# Patient Record
Sex: Female | Born: 1970 | Hispanic: Yes | Smoking: Never smoker
Health system: Southern US, Community
[De-identification: ages and names within clinical notes are randomized; demographics above are authoritative.]

## PROBLEM LIST (undated history)

## (undated) DIAGNOSIS — R519 Headache, unspecified: Secondary | ICD-10-CM

## (undated) DIAGNOSIS — B009 Herpesviral infection, unspecified: Secondary | ICD-10-CM

## (undated) DIAGNOSIS — I10 Essential (primary) hypertension: Secondary | ICD-10-CM

## (undated) DIAGNOSIS — E785 Hyperlipidemia, unspecified: Secondary | ICD-10-CM

## (undated) DIAGNOSIS — D649 Anemia, unspecified: Secondary | ICD-10-CM

## (undated) DIAGNOSIS — A159 Respiratory tuberculosis unspecified: Secondary | ICD-10-CM

## (undated) DIAGNOSIS — R51 Headache: Secondary | ICD-10-CM

## (undated) HISTORY — PX: CHOLECYSTECTOMY: SHX55

## (undated) HISTORY — DX: Anemia, unspecified: D64.9

## (undated) HISTORY — DX: Headache: R51

## (undated) HISTORY — DX: Herpesviral infection, unspecified: B00.9

## (undated) HISTORY — DX: Essential (primary) hypertension: I10

## (undated) HISTORY — DX: Respiratory tuberculosis unspecified: A15.9

## (undated) HISTORY — DX: Hyperlipidemia, unspecified: E78.5

## (undated) HISTORY — PX: OTHER SURGICAL HISTORY: SHX169

## (undated) HISTORY — DX: Headache, unspecified: R51.9

---

## 2004-07-31 ENCOUNTER — Emergency Department: Payer: Self-pay | Admitting: General Practice

## 2007-07-05 ENCOUNTER — Ambulatory Visit: Payer: Self-pay | Admitting: Family Medicine

## 2007-08-26 ENCOUNTER — Emergency Department: Payer: Self-pay | Admitting: Emergency Medicine

## 2009-02-10 ENCOUNTER — Emergency Department: Payer: Self-pay | Admitting: Emergency Medicine

## 2013-05-10 DIAGNOSIS — N814 Uterovaginal prolapse, unspecified: Secondary | ICD-10-CM | POA: Insufficient documentation

## 2013-05-10 DIAGNOSIS — N3946 Mixed incontinence: Secondary | ICD-10-CM | POA: Insufficient documentation

## 2013-05-10 DIAGNOSIS — IMO0002 Reserved for concepts with insufficient information to code with codable children: Secondary | ICD-10-CM | POA: Insufficient documentation

## 2013-06-18 ENCOUNTER — Emergency Department: Payer: Self-pay | Admitting: Emergency Medicine

## 2013-08-02 DIAGNOSIS — K5900 Constipation, unspecified: Secondary | ICD-10-CM | POA: Insufficient documentation

## 2013-10-10 HISTORY — PX: COLONOSCOPY: SHX174

## 2014-01-08 ENCOUNTER — Ambulatory Visit: Payer: Self-pay | Admitting: Gastroenterology

## 2014-02-11 ENCOUNTER — Ambulatory Visit: Payer: Self-pay | Admitting: Gastroenterology

## 2014-02-21 LAB — PATHOLOGY REPORT

## 2014-02-24 ENCOUNTER — Ambulatory Visit: Payer: Self-pay | Admitting: Surgery

## 2014-02-25 ENCOUNTER — Ambulatory Visit: Payer: Self-pay | Admitting: Surgery

## 2014-02-26 LAB — PATHOLOGY REPORT

## 2014-03-19 ENCOUNTER — Emergency Department: Payer: Self-pay | Admitting: Emergency Medicine

## 2014-03-19 LAB — URINALYSIS, COMPLETE
BACTERIA: NONE SEEN
BILIRUBIN, UR: NEGATIVE
Blood: NEGATIVE
Glucose,UR: NEGATIVE mg/dL (ref 0–75)
KETONE: NEGATIVE
Leukocyte Esterase: NEGATIVE
Nitrite: NEGATIVE
PH: 6 (ref 4.5–8.0)
Protein: NEGATIVE
SPECIFIC GRAVITY: 1.002 (ref 1.003–1.030)
Squamous Epithelial: 1
WBC UR: NONE SEEN /HPF (ref 0–5)

## 2014-03-19 LAB — CBC WITH DIFFERENTIAL/PLATELET
BASOS ABS: 0.1 10*3/uL (ref 0.0–0.1)
Basophil %: 0.7 %
Eosinophil #: 0.2 10*3/uL (ref 0.0–0.7)
Eosinophil %: 2.7 %
HCT: 34.3 % — ABNORMAL LOW (ref 35.0–47.0)
HGB: 10.6 g/dL — ABNORMAL LOW (ref 12.0–16.0)
LYMPHS PCT: 33 %
Lymphocyte #: 2.8 10*3/uL (ref 1.0–3.6)
MCH: 23 pg — AB (ref 26.0–34.0)
MCHC: 30.9 g/dL — AB (ref 32.0–36.0)
MCV: 74 fL — AB (ref 80–100)
Monocyte #: 0.6 x10 3/mm (ref 0.2–0.9)
Monocyte %: 6.7 %
NEUTROS ABS: 4.8 10*3/uL (ref 1.4–6.5)
Neutrophil %: 56.9 %
Platelet: 337 10*3/uL (ref 150–440)
RBC: 4.61 10*6/uL (ref 3.80–5.20)
RDW: 17.1 % — AB (ref 11.5–14.5)
WBC: 8.5 10*3/uL (ref 3.6–11.0)

## 2014-03-19 LAB — LIPASE, BLOOD: LIPASE: 181 U/L (ref 73–393)

## 2014-03-19 LAB — COMPREHENSIVE METABOLIC PANEL
ALBUMIN: 3.9 g/dL (ref 3.4–5.0)
ANION GAP: 4 — AB (ref 7–16)
Alkaline Phosphatase: 71 U/L
BUN: 13 mg/dL (ref 7–18)
Bilirubin,Total: 0.6 mg/dL (ref 0.2–1.0)
CALCIUM: 9.1 mg/dL (ref 8.5–10.1)
CHLORIDE: 105 mmol/L (ref 98–107)
Co2: 28 mmol/L (ref 21–32)
Creatinine: 0.73 mg/dL (ref 0.60–1.30)
EGFR (African American): >60
EGFR (Non-African Amer.): >60
Glucose: 96 mg/dL (ref 65–99)
Osmolality: 274 (ref 275–301)
POTASSIUM: 3.8 mmol/L (ref 3.5–5.1)
SGOT(AST): 15 U/L (ref 15–37)
SGPT (ALT): 20 U/L (ref 12–78)
SODIUM: 137 mmol/L (ref 136–145)
TOTAL PROTEIN: 8 g/dL (ref 6.4–8.2)

## 2014-05-20 ENCOUNTER — Ambulatory Visit: Payer: Self-pay | Admitting: Gastroenterology

## 2014-06-03 ENCOUNTER — Ambulatory Visit: Payer: Self-pay | Admitting: Gastroenterology

## 2015-01-30 ENCOUNTER — Ambulatory Visit: Admit: 2015-01-30 | Payer: Self-pay | Admitting: Hematology and Oncology

## 2015-01-31 NOTE — Op Note (Signed)
PATIENT NAME:  Alisha Wang, Alisha Wang MR#:  [id number] DATE OF BIRTH:  1971-02-04  DATE OF PROCEDURE:  02/25/2014  PREOPERATIVE DIAGNOSIS: Chronic cholecystitis and cholelithiasis.   POSTOPERATIVE DIAGNOSIS: Chronic cholecystitis and cholelithiasis.   PROCEDURE: Laparoscopic cholecystectomy and cholangiogram.   SURGEON: Renda RollsWilton Tejon Gracie, M.D.   ANESTHESIA: General.   INDICATIONS: This 44 year old female has a history of epigastric pain and ultrasound findings of gallstones and surgery was recommended for definitive treatment.   DESCRIPTION OF PROCEDURE: The patient was placed on the operating table in the supine position under general anesthesia. The abdomen was prepared with ChloraPrep and draped in a sterile manner. The first incision was made in the inferior aspect of the umbilicus and carried down to the deep fascia, which was grasped with laryngeal hook and elevated. A Veress needle was inserted, aspirated, and irrigated with a saline solution. Next, the peritoneal cavity was inflated with carbon dioxide. The Veress needle was removed. The 10 mm cannula was inserted. The 10 mm, 0 degree laparoscope was inserted to view the peritoneal cavity. Another incision was made in the epigastrium, slightly to the right of the midline to introduce an 11 mm cannula. Two incisions were made in the lateral aspect of the right upper quadrant to introduce two 5 mm cannulas.   Next, with the patient in the reverse Trendelenburg position and turned several degrees to the left, the gallbladder was retracted towards the right shoulder. The gallbladder appeared to have a somewhat white appearance. Multiple adhesions were taken down with blunt and sharp dissection. The pouch of Jon BillingsMorrison was retracted inferiorly and laterally applying traction on the cystic duct. The porta hepatis was demonstrated. A circumferential dissection was carried out around the cystic duct and around the cystic artery. The gallbladder neck was mobilized  with incision of the visceral peritoneum. A critical view of safety was demonstrated. An Endo Clip was placed across the cystic duct adjacent to the neck of the gallbladder. An incision was made in the cystic duct to introduce a Reddick catheter. Half-strength Conray-60 dye was injected as the cholangiogram was done with fluoroscopy, viewing the biliary tree and flow of dye into the duodenum. No retained stones were seen. The Reddick catheter was removed. The cystic duct was doubly ligated with endoclips and divided. The cystic artery was controlled with endoclips and divided. The gallbladder was dissected free from the liver with hook and cautery. The site was irrigated with heparinized saline solution and aspirated. There was minimal bleeding and hemostasis was subsequently intact. The gallbladder was delivered up through the infraumbilical incision, opened and suctioned. Multiple stones were removed with the stone scoop and with the stone forceps. The gallbladder and stones were submitted in formalin for routine pathology.   The right upper quadrant was further inspected. Hemostasis was intact. The cannulas were removed. Carbon dioxide was allowed to escape from the peritoneal cavity. Skin incisions were closed with interrupted 5-0 chromic subcuticular suture, benzoin and Steri-Strips. Dressings were applied with paper tape. The patient tolerated surgery satisfactorily and was then prepared for transfer to the recovery room.   ____________________________ Alisha CommonsJ. Renda RollsWilton Kolston Lacount, Alisha Wang jws:aw D: 02/25/2014 12:02:07 ET T: 02/25/2014 12:19:20 ET JOB#: [id number]  cc: Alisha HareJ. Wilton Jahni Paul, Alisha Wang, <Dictator> Alisha Wang ELECTRONICALLY SIGNED 03/03/2014 9:11

## 2015-02-12 ENCOUNTER — Inpatient Hospital Stay: Payer: BLUE CROSS/BLUE SHIELD | Attending: Hematology and Oncology | Admitting: Hematology and Oncology

## 2015-02-12 ENCOUNTER — Ambulatory Visit: Payer: Self-pay | Admitting: Hematology and Oncology

## 2015-02-12 ENCOUNTER — Encounter: Payer: Self-pay | Admitting: Hematology and Oncology

## 2015-02-12 ENCOUNTER — Other Ambulatory Visit: Payer: BLUE CROSS/BLUE SHIELD

## 2015-02-12 VITALS — BP 133/85 | HR 85 | Temp 97.9°F | Resp 18 | Ht 64.0 in | Wt 177.5 lb

## 2015-02-12 DIAGNOSIS — Z9049 Acquired absence of other specified parts of digestive tract: Secondary | ICD-10-CM | POA: Insufficient documentation

## 2015-02-12 DIAGNOSIS — D509 Iron deficiency anemia, unspecified: Secondary | ICD-10-CM | POA: Diagnosis present

## 2015-02-12 DIAGNOSIS — Z79899 Other long term (current) drug therapy: Secondary | ICD-10-CM | POA: Diagnosis not present

## 2015-02-12 DIAGNOSIS — R42 Dizziness and giddiness: Secondary | ICD-10-CM | POA: Diagnosis not present

## 2015-02-12 DIAGNOSIS — K648 Other hemorrhoids: Secondary | ICD-10-CM | POA: Diagnosis not present

## 2015-02-12 DIAGNOSIS — K8020 Calculus of gallbladder without cholecystitis without obstruction: Secondary | ICD-10-CM

## 2015-02-12 DIAGNOSIS — E785 Hyperlipidemia, unspecified: Secondary | ICD-10-CM | POA: Insufficient documentation

## 2015-02-12 DIAGNOSIS — Z90710 Acquired absence of both cervix and uterus: Secondary | ICD-10-CM | POA: Diagnosis not present

## 2015-02-12 DIAGNOSIS — D649 Anemia, unspecified: Secondary | ICD-10-CM | POA: Diagnosis not present

## 2015-02-12 DIAGNOSIS — I10 Essential (primary) hypertension: Secondary | ICD-10-CM

## 2015-02-12 DIAGNOSIS — K219 Gastro-esophageal reflux disease without esophagitis: Secondary | ICD-10-CM | POA: Diagnosis not present

## 2015-02-12 LAB — CBC WITH DIFFERENTIAL/PLATELET
Basophils Absolute: 0.1 10*3/uL (ref 0–0.1)
Basophils Relative: 1 %
Eosinophils Absolute: 0.2 10*3/uL (ref 0–0.7)
Eosinophils Relative: 2 %
HCT: 39.6 % (ref 35.0–47.0)
Hemoglobin: 12.8 g/dL (ref 12.0–16.0)
Lymphocytes Relative: 36 %
Lymphs Abs: 2.8 10*3/uL (ref 1.0–3.6)
MCH: 25.9 pg — ABNORMAL LOW (ref 26.0–34.0)
MCHC: 32.3 g/dL (ref 32.0–36.0)
MCV: 80.2 fL (ref 80.0–100.0)
Monocytes Absolute: 0.6 10*3/uL (ref 0.2–0.9)
Monocytes Relative: 8 %
Neutro Abs: 4.2 10*3/uL (ref 1.4–6.5)
Neutrophils Relative %: 53 %
Platelets: 293 10*3/uL (ref 150–440)
RBC: 4.94 MIL/uL (ref 3.80–5.20)
RDW: 16 % — ABNORMAL HIGH (ref 11.5–14.5)
WBC: 7.8 10*3/uL (ref 3.6–11.0)

## 2015-02-12 LAB — COMPREHENSIVE METABOLIC PANEL
ALT: 22 U/L (ref 14–54)
AST: 22 U/L (ref 15–41)
Albumin: 4.3 g/dL (ref 3.5–5.0)
Alkaline Phosphatase: 73 U/L (ref 38–126)
Anion gap: 5 (ref 5–15)
BUN: 14 mg/dL (ref 6–20)
CO2: 26 mmol/L (ref 22–32)
Calcium: 9.1 mg/dL (ref 8.9–10.3)
Chloride: 103 mmol/L (ref 101–111)
Creatinine, Ser: 0.56 mg/dL (ref 0.44–1.00)
GFR calc Af Amer: >60 mL/min (ref >60)
GFR calc non Af Amer: >60 mL/min (ref >60)
Glucose, Bld: 92 mg/dL (ref 65–99)
Potassium: 4.6 mmol/L (ref 3.5–5.1)
Sodium: 134 mmol/L — ABNORMAL LOW (ref 135–145)
Total Bilirubin: 0.8 mg/dL (ref 0.3–1.2)
Total Protein: 8.1 g/dL (ref 6.5–8.1)

## 2015-02-12 LAB — RETICULOCYTES
RBC.: 4.94 MIL/uL (ref 3.80–5.20)
Retic Count, Absolute: 54.3 10*3/uL (ref 19.0–183.0)
Retic Ct Pct: 1.1 % (ref 0.4–3.1)

## 2015-02-12 NOTE — Progress Notes (Signed)
[facility] day:  02/12/2015  Chief Complaint: Hang Ammon is an 44 y.o. female with anemia who is referred in consultation by Dr. Arther Dames.  HPI:  The patient notes a longstanding history of anemia since prior to the birth of her son 80 years ago.  During her pregnancy, she took oral iron.  She has been on and off iron since that time.  She has never required a transfusion.    She developed abdominal pain last year.  During that time, she was noted to be anemic.  Upper endoscopy by Dr. Arther Dames on 02/11/2014 revealed a very small amount of hematin in the gastric fundus and body.  No source was seen.  Biopsies revealed no gastritis, intestinal metaplasia, malignancy or Helicobacter pylori.  Notes indicate a history of reflux.  She was previously on Protonix (discontued on 04/14/2014).  Imaging studies revealed cholelithiasis.  She underwent cholecystectomy on 02/15/2014.  She has had no abdominal pain since that time.  Colonoscopy on 05/20/2014 revealed only internal hemorrhoids.  Capsule endoscopy on 01/21/2015 small flecks of blood in the stomach and normal small bowel study.  It was recommended that she continue iron pills twice a day. She states that she stopped taking her iron pills a month ago.  The patient notes a good diet as she eats "some of everything".  She eats meat daily.  She denies any pica.  She denies any hematuria.  She denies any vaginal bleeding s/p hysterectomy in 06/2013.  Labs 9 months ago revealed a hematocrit of 33.2, hemoglobin 10.3, MCV 75.5, platelets 304,000, and WBC 7600.  CBC 3 months ago revealed a hematocrit of 36.0, hemoglobin 11.3, MCV 80.5, platelets 266,000, and WBC 8100.  CBC 3 weeks ago revealed a hematocrit of 39.5, hemoglobin 12.4, MCV 82.8, platelets 306,000, and WBC 7100.  Symptomatically, she feels well.  She notes intermittent dizzy spells.  She denies any fevers, sweats, or weight loss.  Past  Medical History  Diagnosis Date  . Anemia   . Hypertension   . Tuberculosis     skin test was positive in 1992, never had TB symptoms    Past Surgical History  Procedure Laterality Date  . Hysterectomy 2014    . Cholecystectomy    . C section 2011    . Right knee surgery 2000      Family history:  Mother- hypertension.  Father- prostate cancer and anemia.  Sister- anemia.  Social History:  reports that she has never smoked. She has never used smokeless tobacco. She reports that she drinks about 0.6 oz of alcohol per week. She reports that she does not use illicit drugs.  The patient is accompanied by her son, cousin, and Spanish interpreter.  Allergies: No Known Allergies  Current Outpatient Prescriptions  Medication Sig Dispense Refill  . atorvastatin (LIPITOR) 10 MG tablet Take 10 mg by mouth daily.    Marland Kitchen lisinopril (PRINIVIL,ZESTRIL) 20 MG tablet Take 20 mg by mouth daily.    . valACYclovir (VALTREX) 1000 MG tablet Take 1 g by mouth every morning.  1   No current facility-administered medications for this visit.    ROS  GENERAL:  Feels good.  Active.  No fevers, sweats or weight loss. PERFORMANCE STATUS (ECOG):  0 HEENT:  No visual changes, runny nose, sore throat, mouth sores or tenderness. Lungs: No shortness of breath or cough.  No hemoptysis. Cardiac:  No chest pain, palpitations, orthopnea, or PND. GI:  No nausea, vomiting, diarrhea, constipation, melena or hematochezia. GU:  No urgency, frequency, dysuria, or hematuria. Musculoskeletal:  No back pain.  No joint pain.  No muscle tenderness. Extremities:  No pain or swelling. Skin:  No rashes or skin changes. Neuro:  Intermittent headache (chronic).  Intermittent dizziness (no precipitating event).  No numbness or weakness, balance or coordination issues. Endocrine:  No diabetes, thyroid issues, hot flashes or night sweats. Psych:  No mood changes, depression or anxiety. Pain:  No focal pain. Review of systems:   All other systems reviewed and found to be negative.   Physical Exam  Blood pressure 133/85, pulse 85, temperature 97.9 F (36.6 C), temperature source Oral, resp. rate 18, height '5\' 4"'  (1.626 m), weight 177 lb 7.5 oz (80.5 kg), SpO2 99 %. GENERAL:  Well developed, well nourished woman, sitting comfortably in the exam room in no acute distress. MENTAL STATUS:  Alert and oriented to person, place and time. HEAD:  Short dark hair.  Normocephalic, atraumatic, face symmetric, no Cushingoid features. EYES:  Brown eyes.  Pupils equal round and reactive to light and accomodation.  No conjunctivitis or scleral icterus. ENT:  Oropharynx clear without lesion.  Tongue normal. Mucous membranes moist.  RESPIRATORY:  Clear to auscultation without rales, wheezes or rhonchi. CARDIOVASCULAR:  Regular rate and rhythm without murmur, rub or gallop. ABDOMEN:  Well healed laparoscopic incisions.  Soft, non-tender, with active bowel sounds, and no hepatosplenomegaly.  No masses. SKIN:  No rashes, ulcers or lesions. EXTREMITIES: No edema, no skin discoloration or tenderness.  No palpable cords. LYMPH NODES: No palpable cervical, supraclavicular, axillary or inguinal adenopathy  NEUROLOGICAL: Unremarkable. PSYCH:  Appropriate.  Appointment on 02/12/2015  Component Date Value Ref Range Status  . WBC 02/12/2015 7.8  3.6 - 11.0 K/uL Final  . RBC 02/12/2015 4.94  3.80 - 5.20 MIL/uL Final  . Hemoglobin 02/12/2015 12.8  12.0 - 16.0 g/dL Final  . HCT 02/12/2015 39.6  35.0 - 47.0 % Final  . MCV 02/12/2015 80.2  80.0 - 100.0 fL Final  . MCH 02/12/2015 25.9* 26.0 - 34.0 pg Final  . MCHC 02/12/2015 32.3  32.0 - 36.0 g/dL Final  . RDW 02/12/2015 16.0* 11.5 - 14.5 % Final  . Platelets 02/12/2015 293  150 - 440 K/uL Final  . Neutrophils Relative % 02/12/2015 53   Final  . Neutro Abs 02/12/2015 4.2  1.4 - 6.5 K/uL Final  . Lymphocytes Relative 02/12/2015 36   Final  . Lymphs Abs 02/12/2015 2.8  1.0 - 3.6 K/uL Final  .  Monocytes Relative 02/12/2015 8   Final  . Monocytes Absolute 02/12/2015 0.6  0.2 - 0.9 K/uL Final  . Eosinophils Relative 02/12/2015 2   Final  . Eosinophils Absolute 02/12/2015 0.2  0 - 0.7 K/uL Final  . Basophils Relative 02/12/2015 1   Final  . Basophils Absolute 02/12/2015 0.1  0 - 0.1 K/uL Final  . Sodium 02/12/2015 134* 135 - 145 mmol/L Corrected  . Potassium 02/12/2015 4.6  3.5 - 5.1 mmol/L Corrected  . Chloride 02/12/2015 103  101 - 111 mmol/L Corrected  . CO2 02/12/2015 26  22 - 32 mmol/L Corrected  . Glucose, Bld 02/12/2015 92  65 - 99 mg/dL Corrected  . BUN 02/12/2015 14  6 - 20 mg/dL Corrected  . Creatinine, Ser 02/12/2015 0.56  0.44 - 1.00 mg/dL Corrected  . Calcium 02/12/2015 9.1  8.9 - 10.3 mg/dL Corrected  . Total Protein 02/12/2015 8.1  6.5 - 8.1 g/dL  Corrected  . Albumin 02/12/2015 4.3  3.5 - 5.0 g/dL Corrected  . AST 02/12/2015 22  15 - 41 U/L Corrected  . ALT 02/12/2015 22  14 - 54 U/L Corrected  . Alkaline Phosphatase 02/12/2015 73  38 - 126 U/L Corrected  . Total Bilirubin 02/12/2015 0.8  0.3 - 1.2 mg/dL Corrected  . GFR calc non Af Amer 02/12/2015 >60  >60 mL/min Corrected  . GFR calc Af Amer 02/12/2015 >60  >60 mL/min Corrected   Comment: (NOTE) The eGFR has been calculated using the CKD EPI equation. This calculation has not been validated in all clinical situations. eGFR's persistently <90 mL/min signify possible Chronic Kidney Disease. CORRECTED ON 05/05 AT 1348: PREVIOUSLY REPORTED AS >60   . Anion gap 02/12/2015 5  5 - 15 Corrected  . Retic Ct Pct 02/12/2015 1.1  0.4 - 3.1 % Final  . RBC. 02/12/2015 4.94  3.80 - 5.20 MIL/uL Final  . Retic Count, Manual 02/12/2015 54.3  19.0 - 183.0 K/uL Final    Assessment:  The patient is a 89 year old woman with an 18 year history of anemia.  In 2015, she was diagnosed with microcytic anemia consistent with iron deficiency.  Labs at that time included a hematocrit of 33.2, hemoglobin 10.4 and MCV 75.5 (low).  Labs in  12/2014 revealed a hematocrit of 39.5, hemoglobin 12.4, and MCV 82.7 (normal).    EGD on 02/11/2014 revealed a very small amount of hematin in the gastric fundus and body.  No source was seen.  Biopsies were negative.  She has a history of reflux.  She was previously on Protonix ( discontinued on 04/14/2014).  Colonoscopy on 05/20/2014 revealed only internal hemorrhoids.  Capsule endoscopy on 01/21/2015 small flecks of blood in the stomach and normal small bowel study.   Diet is good.  She denies any hematuria, melena or hematochezia.  She is status post hysterectomy in 06/2013.  Symptomatically, she feels well.  She notes intermittent dizzy spells.  She denies any fevers, sweats, or weight loss.  Plan: 1)  Discuss history of mild iron deficiency anemia and negative GI work-up except for small amount of blood in stomach.  Last available CBC normal with resolution of microcytic RBC indices.  With patient off iron x 1 month, recheck labs and iron studies.  Diet is good.  Goal ferritin 100-400. 2)  Labs today:  CBC with diff, CMP, retic, ferritin, iron studies. 3)  RTC in 1-2 weeks to review labs and discuss direction of therapy.  Lequita Asal, MD  02/12/2015, 7:56 PM

## 2015-02-13 ENCOUNTER — Encounter: Payer: Self-pay | Admitting: Hematology and Oncology

## 2015-02-26 ENCOUNTER — Ambulatory Visit: Payer: BLUE CROSS/BLUE SHIELD | Admitting: Hematology and Oncology

## 2015-03-02 ENCOUNTER — Ambulatory Visit: Payer: BLUE CROSS/BLUE SHIELD | Admitting: Hematology and Oncology

## 2015-03-10 ENCOUNTER — Inpatient Hospital Stay (HOSPITAL_BASED_OUTPATIENT_CLINIC_OR_DEPARTMENT_OTHER): Payer: BLUE CROSS/BLUE SHIELD | Admitting: Hematology and Oncology

## 2015-03-10 VITALS — BP 114/75 | HR 80 | Temp 98.1°F | Resp 17 | Ht 62.0 in | Wt 179.0 lb

## 2015-03-10 DIAGNOSIS — D509 Iron deficiency anemia, unspecified: Secondary | ICD-10-CM

## 2015-03-10 DIAGNOSIS — E785 Hyperlipidemia, unspecified: Secondary | ICD-10-CM

## 2015-03-10 DIAGNOSIS — I10 Essential (primary) hypertension: Secondary | ICD-10-CM

## 2015-03-10 DIAGNOSIS — Z9049 Acquired absence of other specified parts of digestive tract: Secondary | ICD-10-CM

## 2015-03-10 DIAGNOSIS — K648 Other hemorrhoids: Secondary | ICD-10-CM

## 2015-03-10 DIAGNOSIS — Z90710 Acquired absence of both cervix and uterus: Secondary | ICD-10-CM

## 2015-03-10 DIAGNOSIS — Z79899 Other long term (current) drug therapy: Secondary | ICD-10-CM

## 2015-03-10 DIAGNOSIS — K219 Gastro-esophageal reflux disease without esophagitis: Secondary | ICD-10-CM

## 2015-03-10 LAB — CBC WITH DIFFERENTIAL/PLATELET
Basophils Absolute: 0 10*3/uL (ref 0–0.1)
Basophils Relative: 1 %
Eosinophils Absolute: 0.2 10*3/uL (ref 0–0.7)
Eosinophils Relative: 3 %
HCT: 37.9 % (ref 35.0–47.0)
Hemoglobin: 12 g/dL (ref 12.0–16.0)
Lymphocytes Relative: 36 %
Lymphs Abs: 2.6 10*3/uL (ref 1.0–3.6)
MCH: 25.3 pg — ABNORMAL LOW (ref 26.0–34.0)
MCHC: 31.6 g/dL — ABNORMAL LOW (ref 32.0–36.0)
MCV: 80.1 fL (ref 80.0–100.0)
Monocytes Absolute: 0.4 10*3/uL (ref 0.2–0.9)
Monocytes Relative: 6 %
Neutro Abs: 4 10*3/uL (ref 1.4–6.5)
Neutrophils Relative %: 54 %
Platelets: 259 10*3/uL (ref 150–440)
RBC: 4.73 MIL/uL (ref 3.80–5.20)
RDW: 15.7 % — ABNORMAL HIGH (ref 11.5–14.5)
WBC: 7.3 10*3/uL (ref 3.6–11.0)

## 2015-03-10 LAB — FERRITIN: Ferritin: 44 ng/mL (ref 11–307)

## 2015-08-17 ENCOUNTER — Other Ambulatory Visit: Payer: Self-pay | Admitting: Family Medicine

## 2015-08-17 DIAGNOSIS — Z0000 Encounter for general adult medical examination without abnormal findings: Secondary | ICD-10-CM

## 2015-08-27 ENCOUNTER — Ambulatory Visit
Admission: RE | Admit: 2015-08-27 | Discharge: 2015-08-27 | Disposition: A | Discharge status: Home / Self Care | Payer: 59 | Source: Ambulatory Visit | Attending: Family Medicine | Admitting: Family Medicine

## 2015-08-27 DIAGNOSIS — Z1231 Encounter for screening mammogram for malignant neoplasm of breast: Secondary | ICD-10-CM | POA: Diagnosis not present

## 2015-08-27 DIAGNOSIS — Z0000 Encounter for general adult medical examination without abnormal findings: Secondary | ICD-10-CM

## 2015-11-17 ENCOUNTER — Encounter: Payer: Self-pay | Admitting: Hematology and Oncology

## 2015-11-17 NOTE — Progress Notes (Signed)
[facility]  Clinic day:  03/10/2015  Chief Complaint: Alisha Wang is an 45 y.o. female with anemia who is seen for review of work-up and discussion regarding direction of therapy.  HPI:  The patient was last seen in the hematology clinic on 02/12/2015.  At that time she was seen for initial assessment. She described an 18 year history of anemia in 2015 she had a microcytic anemia consistent with iron deficiency. MCV was 75.5.  She had undergone EGD and colonoscopy and capsule study. There was a small amount of hematin in the gastric fundus and body but no source identified.   She has reflux. Capsule endoscopy revealed small flecks of blood in the stomach.  Diet appeared good. She denied any melena or hematochezia. She is status post hysterectomy.  Workup at last visit included a hematocrit 39.6, hemoglobin 12.8, MCV 80.2, platelets 293,000, white count 7800 with an ANC of 4200. Creatinine was 0.56. Liver function tests were normal. Reticulocyte count was 1.1%.   Ferritin and iron studies were inadvertently not done.  Symptomatically, she feels good.  She denies any symptoms. She notes weight gain. Regarding her diet, she eats rice, chicken, salad, and fruit. She has no pica.  She denies any melena or hematochezia. She has been off oral iron for a month.  Past Medical History  Diagnosis Date  . Anemia   . Hypertension   . Tuberculosis     skin test was positive in 1992, never had TB symptoms  . Herpes simplex     (recurrent)  . Hyperlipidemia   . Headache     Past Surgical History  Procedure Laterality Date  . Hysterectomy 2014    . Cholecystectomy    . C section 2011    . Right knee surgery 2000      Family history:  Mother- hypertension.  Father- prostate cancer and anemia.  Sister- anemia.  Social History:  reports that she has never smoked. She has never used smokeless tobacco. She reports that she drinks about 0.6 oz of alcohol per  week. She reports that she does not use illicit drugs.  The patient is accompanied by her 5 year old brother, and Charlesetta Garibaldi, a 80 year old, and Spanish interpreter.  Allergies: No Known Allergies  Current Outpatient Prescriptions  Medication Sig Dispense Refill  . atorvastatin (LIPITOR) 10 MG tablet Take 10 mg by mouth daily.    Marland Kitchen lisinopril (PRINIVIL,ZESTRIL) 20 MG tablet Take 20 mg by mouth daily.    . valACYclovir (VALTREX) 1000 MG tablet Take 1 g by mouth every morning.  1   No current facility-administered medications for this visit.    ROS  GENERAL:  Feels good.  Active.  No fevers, sweats.  Weight gain PERFORMANCE STATUS (ECOG):  0 HEENT:  No visual changes, runny nose, sore throat, mouth sores or tenderness. Lungs: No shortness of breath or cough.  No hemoptysis. Cardiac:  No chest pain, palpitations, orthopnea, or PND. GI:  No nausea, vomiting, diarrhea, constipation, melena or hematochezia. GU:  No urgency, frequency, dysuria, or hematuria. Musculoskeletal:  No back pain.  No joint pain.  No muscle tenderness. Extremities:  No pain or swelling. Skin:  No rashes or skin changes. Neuro:  Intermittent headache and dizziness.  No numbness or weakness, balance or coordination issues. Endocrine:  No diabetes, thyroid issues, hot flashes or night sweats. Psych:  No mood changes, depression or anxiety. Pain:  No focal pain. Review of systems:  All other systems reviewed and found to be negative.   Physical Exam  Blood pressure 114/75, pulse 80, temperature 98.1 F (36.7 C), temperature source Oral, resp. rate 17, height bI$  (1.575 m), weight 179 lb 0.2 oz (81.2 kg). GENERAL:  Well developed, well nourished woman, sitting comfortably in the exam room in no acute distress. MENTAL STATUS:  Alert and oriented to person, place and time. HEAD:  Short dark hair.  Normocephalic, atraumatic, face symmetric, no Cushingoid features. EYES:  Brown eyes.  No conjunctivitis or scleral  icterus. NEUROLOGICAL: Unremarkable. PSYCH:  Appropriate.  Office Visit on 03/10/2015  Component Date Value Ref Range Status  . WBC 03/10/2015 7.3  3.6 - 11.0 K/uL Final  . RBC 03/10/2015 4.73  3.80 - 5.20 MIL/uL Final  . Hemoglobin 03/10/2015 12.0  12.0 - 16.0 g/dL Final  . HCT 47/82/9562 37.9  35.0 - 47.0 % Final  . MCV 03/10/2015 80.1  80.0 - 100.0 fL Final  . MCH 03/10/2015 25.3* 26.0 - 34.0 pg Final  . MCHC 03/10/2015 31.6* 32.0 - 36.0 g/dL Final  . RDW 13/05/6577 15.7* 11.5 - 14.5 % Final  . Platelets 03/10/2015 259  150 - 440 K/uL Final  . Neutrophils Relative % 03/10/2015 54   Final  . Neutro Abs 03/10/2015 4.0  1.4 - 6.5 K/uL Final  . Lymphocytes Relative 03/10/2015 36   Final  . Lymphs Abs 03/10/2015 2.6  1.0 - 3.6 K/uL Final  . Monocytes Relative 03/10/2015 6   Final  . Monocytes Absolute 03/10/2015 0.4  0.2 - 0.9 K/uL Final  . Eosinophils Relative 03/10/2015 3   Final  . Eosinophils Absolute 03/10/2015 0.2  0 - 0.7 K/uL Final  . Basophils Relative 03/10/2015 1   Final  . Basophils Absolute 03/10/2015 0.0  0 - 0.1 K/uL Final  . Ferritin 03/10/2015 44  11 - 307 ng/mL Final    Assessment:  The patient is a 6 year old woman with an 18 year history of anemia.  In 2015, she was diagnosed with microcytic anemia consistent with iron deficiency.  Labs at that time included a hematocrit of 33.2, hemoglobin 10.4 and MCV 75.5 (low).  Labs in 12/2014 revealed a hematocrit of 39.5, hemoglobin 12.4, and MCV 82.7 (normal).  She is no longer anemic.  EGD on 02/11/2014 revealed a very small amount of hematin in the gastric fundus and body.  No source was seen.  Biopsies were negative.  She has a history of reflux.  She was previously on Protonix ( discontinued on 04/14/2014).  Colonoscopy on 05/20/2014 revealed only internal hemorrhoids.  Capsule endoscopy on 01/21/2015 small flecks of blood in the stomach and normal small bowel study.   Workup on 02/12/2015 included a hematocrit 39.6,  hemoglobin 12.8, MCV 80.2, platelets 293,000, white count 7800 with an ANC of 4200. Creatinine was 0.56. Liver function tests were normal. Reticulocyte count was 1.1%.   Ferritin and iron studies were inadvertently not done.  Diet is good.  She denies any hematuria, melena or hematochezia.  She is status post hysterectomy in 06/2013.  Symptomatically, she feels well.  She denies any fevers, sweats, or weight loss.  Plan: 1)  Review labs.  Hematocrit is now normal.  Suspect resolved iron deficiency anemia. 2)  Labs today:  CBC with diff, ferritin. 3)  Call patient with results (cell: [phone]). 4)  RTC prn.  Addendum:  CBC today includes a hematocrit of 37.9, hemoglobin 12.0, MCV 80.1, platelets 259,000, white count 7300 with  an ANC of 4000.  Ferritin is 44.   Rosey Bath, MD  03/10/2015

## 2016-07-05 ENCOUNTER — Emergency Department: Payer: Worker's Compensation

## 2016-07-05 ENCOUNTER — Emergency Department
Admission: EM | Admit: 2016-07-05 | Discharge: 2016-07-05 | Disposition: A | Discharge status: Home / Self Care | Payer: Worker's Compensation | Attending: Emergency Medicine | Admitting: Emergency Medicine

## 2016-07-05 DIAGNOSIS — I10 Essential (primary) hypertension: Secondary | ICD-10-CM | POA: Diagnosis not present

## 2016-07-05 DIAGNOSIS — Y9389 Activity, other specified: Secondary | ICD-10-CM | POA: Insufficient documentation

## 2016-07-05 DIAGNOSIS — Z79899 Other long term (current) drug therapy: Secondary | ICD-10-CM | POA: Insufficient documentation

## 2016-07-05 DIAGNOSIS — W228XXA Striking against or struck by other objects, initial encounter: Secondary | ICD-10-CM | POA: Diagnosis not present

## 2016-07-05 DIAGNOSIS — S8992XA Unspecified injury of left lower leg, initial encounter: Secondary | ICD-10-CM | POA: Diagnosis present

## 2016-07-05 DIAGNOSIS — Y929 Unspecified place or not applicable: Secondary | ICD-10-CM | POA: Insufficient documentation

## 2016-07-05 DIAGNOSIS — S8002XA Contusion of left knee, initial encounter: Secondary | ICD-10-CM | POA: Diagnosis not present

## 2016-07-05 DIAGNOSIS — Y990 Civilian activity done for income or pay: Secondary | ICD-10-CM | POA: Diagnosis not present

## 2016-07-05 MED ORDER — HYDROCODONE-ACETAMINOPHEN 5-325 MG PO TABS
1.0000 | ORAL_TABLET | ORAL | 0 refills | Status: DC | PRN
Start: 2016-07-05 — End: 2017-10-17

## 2016-07-05 MED ORDER — NAPROXEN 500 MG PO TABS: 500.0000 mg | ORAL_TABLET | Freq: Two times a day (BID) | ORAL | 0 refills | Status: DC

## 2016-07-05 NOTE — ED Notes (Signed)
Pt was given a copy of W/C paper work for herself and her employer. I unintentionally sent the remaining copies of the W/C documentation down to the lab with the patients specimen. For medical records I attempted to make a copy of the patients W/C form that I initially gave to the patient. Unfortunately the document was not legible once I printed, therefore there is no actual copy of the W/C paperwork at hand. The remaining copies are in the patients urine specimen bag which cannot be re-opened. RN and Tech assigned to care for pt have been informed of this

## 2016-07-05 NOTE — ED Notes (Signed)
Huntley DecSara, EDT in triage to complete workers comp profile

## 2016-07-05 NOTE — ED Provider Notes (Signed)
[facility] Emergency Department Provider Note   ____________________________________________   First MD Initiated Contact with Patient 07/05/16 2016     (approximate)  I have reviewed the triage vital signs and the nursing notes.   HISTORY  Chief Complaint Knee Injury    HPI Tanaysia Bhardwaj is a 45 y.o. female presents for evaluation of left knee pain. 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 Patient states that the pain radiates from her left knee down her leg. Denies any other complaints or injuries.   Past Medical History:  Diagnosis Date  . Anemia   . Headache   . Herpes simplex    (recurrent)  . Hyperlipidemia   . Hypertension   . Tuberculosis    skin test was positive in 1992, never had TB symptoms    Patient Active Problem List   Diagnosis Date Noted  . Iron deficiency anemia 03/10/2015    Past Surgical History:  Procedure Laterality Date  . C Section 2011    . CHOLECYSTECTOMY    . hysterectomy 2014    . right knee surgery 2000      Prior to Admission medications   Medication Sig Start Date End Date Taking? Authorizing Provider  atorvastatin (LIPITOR) 10 MG tablet Take 10 mg by mouth daily.    Historical Provider, MD  HYDROcodone-acetaminophen (NORCO) 5-325 MG tablet Take 1-2 tablets by mouth every 4 (four) hours as needed for moderate pain. 07/05/16   Charmayne Sheer Sarha Bartelt, PA-C  lisinopril (PRINIVIL,ZESTRIL) 20 MG tablet Take 20 mg by mouth daily.    Historical Provider, MD  naproxen (NAPROSYN) 500 MG tablet Take 1 tablet (500 mg total) by mouth 2 (two) times daily with a meal. 07/05/16   Evangeline Dakin, PA-C  valACYclovir (VALTREX) 1000 MG tablet Take 1 g by mouth every morning. 01/17/15   Historical Provider, MD    Allergies Review of patient's allergies indicates no known allergies.  Family History  Problem Relation Age of Onset  . Hypertension Mother   . Anemia Father   . Prostate cancer Father   .  Anemia Sister     Social History Social History  Substance Use Topics  . Smoking status: Never Smoker  . Smokeless tobacco: Never Used  . Alcohol use 0.6 oz/week    1 Cans of beer per week     Comment: 1-2 /month    Review of Systems Constitutional: No fever/chills Eyes: No visual changes. ENT: No sore throat. Cardiovascular: Denies chest pain. Respiratory: Denies shortness of breath. Gastrointestinal: No abdominal pain.  No nausea, no vomiting.  No diarrhea.  No constipation. Genitourinary: Negative for dysuria. Musculoskeletal: Positive for left knee pain. Skin: Negative for rash. Neurological: Negative for headaches, focal weakness or numbness.  10-point ROS otherwise negative.  ____________________________________________   PHYSICAL EXAM:  VITAL SIGNS: ED Triage Vitals  Enc Vitals Group     BP 07/05/16 1946 (!) 150/83     Pulse Rate 07/05/16 1946 92     Resp 07/05/16 1946 18     Temp 07/05/16 1946 97.6 F (36.4 C)     Temp Source 07/05/16 1946 Oral     SpO2 07/05/16 1946 99 %     Weight 07/05/16 1946 170 lb (77.1 kg)     Height 07/05/16 1946 5\' 4"  (1.626 m)     Head Circumference --      Peak Flow --      Pain Score 07/05/16 1947 8  Pain Loc --      Pain Edu? --      Excl. in GC? --     Constitutional: Alert and oriented. Well appearing and in no acute distress. Musculoskeletal: No lower extremity tenderness nor edema.  No joint effusions. Neurologic:  Normal speech and language. No gross focal neurologic deficits are appreciated. Gait not tested secondary to pain. No patellar tenderness point tenderness noted to the lateral aspect of the knees. Skin:  Skin is warm, dry and intact. No rash noted. Psychiatric: Mood and affect are normal. Speech and behavior are normal.  ____________________________________________   LABS (all labs ordered are listed, but only abnormal results are displayed)  Labs Reviewed - No data to  display ____________________________________________  EKG   ____________________________________________  RADIOLOGY  No acute osseous findings. ____________________________________________   PROCEDURES  Procedure(s) performed: None  Procedures  Critical Care performed: No  ____________________________________________   INITIAL IMPRESSION / ASSESSMENT AND PLAN / ED COURSE  Pertinent labs & imaging results that were available during my care of the patient were reviewed by me and considered in my medical decision making (see chart for details).  Status post fall acute left knee contusion. Rx given for Naprosyn 500 mg twice a day. Allergies of ice and elevation. Work excuse 24 hours. Use crutches as needed for ambulation. Follow-up with orthopedics if not feeling better in the next 3-4 days.  Clinical Course     ____________________________________________   FINAL CLINICAL IMPRESSION(S) / ED DIAGNOSES  Final diagnoses:  Knee contusion, left, initial encounter      NEW MEDICATIONS STARTED DURING THIS VISIT:  New Prescriptions   HYDROCODONE-ACETAMINOPHEN (NORCO) 5-325 MG TABLET    Take 1-2 tablets by mouth every 4 (four) hours as needed for moderate pain.   NAPROXEN (NAPROSYN) 500 MG TABLET    Take 1 tablet (500 mg total) by mouth 2 (two) times daily with a meal.     Note:  This document was prepared using Dragon voice recognition software and may include unintentional dictation errors.   Evangeline Dakinharles M Tony Friscia, PA-C 07/05/16 2135    Rockne MenghiniAnne-Caroline Norman, MD 07/05/16 2217

## 2016-07-05 NOTE — ED Notes (Signed)
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 Registration notified.

## 2016-07-05 NOTE — ED Notes (Signed)
I completed pt worker comp. Everything was within normal limits and time frame. Pt compliant with total procedure. Urine was read within time limits and was accurate temperature. Pt verbalized understanding of the W/C process and refused a translator stating "im fine I understand what your saying, its ok". Pt signed neccessary documents. Urine was taken to the lab within proper time frame

## 2016-07-05 NOTE — ED Triage Notes (Signed)
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

## 2016-07-25 ENCOUNTER — Other Ambulatory Visit: Payer: Self-pay | Admitting: Family Medicine

## 2016-07-25 DIAGNOSIS — Z1231 Encounter for screening mammogram for malignant neoplasm of breast: Secondary | ICD-10-CM

## 2016-08-29 ENCOUNTER — Ambulatory Visit
Admission: RE | Admit: 2016-08-29 | Discharge: 2016-08-29 | Disposition: A | Discharge status: Home / Self Care | Payer: BLUE CROSS/BLUE SHIELD | Source: Ambulatory Visit | Attending: Family Medicine | Admitting: Family Medicine

## 2016-08-29 DIAGNOSIS — Z1231 Encounter for screening mammogram for malignant neoplasm of breast: Secondary | ICD-10-CM | POA: Diagnosis not present

## 2016-08-31 ENCOUNTER — Other Ambulatory Visit: Payer: Self-pay | Admitting: Family Medicine

## 2016-08-31 DIAGNOSIS — R928 Other abnormal and inconclusive findings on diagnostic imaging of breast: Secondary | ICD-10-CM

## 2016-09-20 ENCOUNTER — Ambulatory Visit
Admission: RE | Admit: 2016-09-20 | Discharge: 2016-09-20 | Disposition: A | Discharge status: Home / Self Care | Payer: BLUE CROSS/BLUE SHIELD | Source: Ambulatory Visit | Attending: Family Medicine | Admitting: Family Medicine

## 2016-09-20 DIAGNOSIS — R928 Other abnormal and inconclusive findings on diagnostic imaging of breast: Secondary | ICD-10-CM | POA: Diagnosis present

## 2017-08-28 ENCOUNTER — Ambulatory Visit: Payer: Self-pay | Attending: Oncology | Admitting: *Deleted

## 2017-08-28 ENCOUNTER — Encounter: Payer: Self-pay | Admitting: *Deleted

## 2017-08-28 ENCOUNTER — Encounter (INDEPENDENT_AMBULATORY_CARE_PROVIDER_SITE_OTHER): Payer: Self-pay

## 2017-08-28 VITALS — BP 121/80 | HR 87 | Temp 98.9°F | Ht 65.0 in | Wt 175.0 lb

## 2017-08-28 DIAGNOSIS — N644 Mastodynia: Secondary | ICD-10-CM

## 2017-08-28 DIAGNOSIS — N63 Unspecified lump in unspecified breast: Secondary | ICD-10-CM

## 2017-08-28 NOTE — Progress Notes (Signed)
Subjective:     Patient ID: Alisha Wang, female   DOB: 1971/03/30, 46 y.o.   MRN: [id number]  HPI   Review of Systems     Objective:   Physical Exam  Pulmonary/Chest: Right breast exhibits tenderness. Right breast exhibits no inverted nipple, no mass, no nipple discharge and no skin change. Left breast exhibits no inverted nipple, no mass, no nipple discharge, no skin change and no tenderness. Breasts are symmetrical.         Assessment:     46 [facility] old Hispanic [facility]  Lloyda, the interpreter present during the interview and exam.  Patient complains of right breast tenderness for about 4 months.  States it's at the lateral breast, is intermittent, lasting 1-2 minutes at a time.  States it feels like it is "pulling and throbbing".  It's more painful with palpated.  No alleviating factors.  States she does try to massage the area.  Rates the pain a 7 on a 0/10 scale.  On clinical breast exam there is an asymmetrical tender thickening at 10:30-11:00 right breast 2 CFN.  Taught self breast awareness.  Patient with a history of hysterectomy in 2008.  No pap today per protocol.  Patient has been screened for eligibility.  She does not have any insurance, Medicare or Medicaid.  She also meets financial eligibility.  Hand-out given on the Affordable Care Act.    Plan:     Will get bilateral diagnostic mammogram and ultrasound of the right breast.  Will follow-up per [facility] protocol.  Joellyn Quailshristy Burton to schedule patient's mammogram appointment.

## 2017-08-28 NOTE — Addendum Note (Signed)
Addended by: Jim LikeLAMBERT, SHEENA M on: 08/28/2017 01:38 PM   Modules accepted: Orders

## 2017-08-28 NOTE — Patient Instructions (Signed)
Gave patient hand-out, Women Staying Healthy, Active and Well from [facility], with education on breast health, pap smears, heart and colon health. 

## 2017-09-07 ENCOUNTER — Ambulatory Visit
Admission: RE | Admit: 2017-09-07 | Discharge: 2017-09-07 | Disposition: A | Discharge status: Home / Self Care | Payer: Self-pay | Source: Ambulatory Visit | Attending: Oncology | Admitting: Oncology

## 2017-09-07 DIAGNOSIS — N63 Unspecified lump in unspecified breast: Secondary | ICD-10-CM

## 2017-09-14 ENCOUNTER — Telehealth: Payer: Self-pay | Admitting: *Deleted

## 2017-09-14 NOTE — Telephone Encounter (Signed)
Left patient a message via Lloyda, the interpreter to call me back.  I would like to offer surgical consultation for further evaluation of the right breast thickening.

## 2017-09-19 ENCOUNTER — Telehealth: Payer: Self-pay | Admitting: *Deleted

## 2017-09-19 NOTE — Telephone Encounter (Signed)
Patient returned my call this morning.  She states her breast pain is gone.  Discussed possiblity of a surgical consult for the right breast thickening.  She is agreeable.  Appointment scheduled to see Dr. Lemar LivingsByrnett on 10/17/17 @ 1:15.  She is to be there at 12:45, take a photo ID and all her meds.  Will follow-up per [facility] protocol.

## 2017-10-16 ENCOUNTER — Ambulatory Visit: Payer: Self-pay

## 2017-10-17 ENCOUNTER — Ambulatory Visit (INDEPENDENT_AMBULATORY_CARE_PROVIDER_SITE_OTHER): Payer: Self-pay | Admitting: General Surgery

## 2017-10-17 ENCOUNTER — Encounter: Payer: Self-pay | Admitting: General Surgery

## 2017-10-17 VITALS — BP 124/72 | HR 70 | Resp 12 | Ht 64.0 in | Wt 179.0 lb

## 2017-10-17 DIAGNOSIS — I358 Other nonrheumatic aortic valve disorders: Secondary | ICD-10-CM

## 2017-10-17 DIAGNOSIS — N644 Mastodynia: Secondary | ICD-10-CM

## 2017-10-17 NOTE — Progress Notes (Signed)
Patient ID: Alisha Wang, female   DOB: [birth year], 47 y.o.   MRN: [id number]  Chief Complaint  Patient presents with  . Other    HPI Alisha Wang is a 47 y.o. female who presents for a breast evaluation. The most recent mammogram was done on 09/07/2017 and right breast ultrasound. She states she has been having pain in her right breast for about two months now. It was sharpe quick stubbing pains but much better now.  Patient doesn't  perform regular self breast checks and gets regular mammograms done.  The patient denies any nipple drainage with manipulation.  Mother of 4, 23, 52, 85 and 7.  Native of [place].  Interpreter, Beecher Mcardle is present at visit.   HPI  Past Medical History:  Diagnosis Date  . Anemia   . Headache   . Herpes simplex    (recurrent)  . Hyperlipidemia   . Hypertension   . Tuberculosis    skin test was positive in 1992, never had TB symptoms    Past Surgical History:  Procedure Laterality Date  . C Section 2011    . CHOLECYSTECTOMY    . COLONOSCOPY  2015  . hysterectomy 2014    . right knee surgery 2000      Family History  Problem Relation Age of Onset  . Hypertension Mother   . Anemia Father   . Prostate cancer Father   . Anemia Sister     Social History Social History   Tobacco Use  . Smoking status: Never Smoker  . Smokeless tobacco: Never Used  Substance Use Topics  . Alcohol use: Yes    Alcohol/week: 0.6 oz    Types: 1 Cans of beer per week    Comment: 1-2 /month  . Drug use: No    No Known Allergies  Current Outpatient Medications  Medication Sig Dispense Refill  . atorvastatin (LIPITOR) 10 MG tablet Take 10 mg by mouth daily.    Marland Kitchen lisinopril (PRINIVIL,ZESTRIL) 20 MG tablet Take 20 mg by mouth daily.    . valACYclovir (VALTREX) 1000 MG tablet Take 1 g by mouth every morning.  1   No current facility-administered medications for this visit.     Review of Systems Review of Systems  Constitutional:  Negative.   Respiratory: Negative.  Negative for shortness of breath.   Cardiovascular: Negative.  Negative for chest pain and leg swelling.    Blood pressure 124/72, pulse 70, resp. rate 12, height 5\' 4"  (1.626 m), weight 179 lb (81.2 kg).  Physical Exam Physical Exam  Constitutional: She is oriented to person, place, and time. She appears well-developed and well-nourished.  Eyes: Conjunctivae are normal. No scleral icterus.  Neck: Neck supple.  Cardiovascular: Normal rate and regular rhythm.  Murmur heard.  Systolic murmur is present with a grade of 2/6. Pulses:      Carotid pulses are 2+ on the right side, and 2+ on the left side. No transmitted murmur.  Pulmonary/Chest: Effort normal and breath sounds normal. Right breast exhibits no inverted nipple, no mass, no nipple discharge, no skin change and no tenderness. Left breast exhibits no inverted nipple, no mass, no nipple discharge, no skin change and no tenderness.    Lymphadenopathy:    She has no cervical adenopathy.    She has no axillary adenopathy.  Neurological: She is alert and oriented to person, place, and time.  Skin: Skin is warm and dry.    Data Reviewed September 07, 2017 diagnostic mammograms in  prior years exam was reviewed.  Focal thickening in the right upper outer quadrant noted during the November/December 2017 exam.  Cleared with focal spot compression views. Current study: BI-RADS-1. The patient reports she was told that she had "dense breast".  The majority of the breast is fatty replaced with the exception of the upper outer quadrant on the right side which is modestly dense at best.  Assessment    Benign breast exam.  Asymptomatic aortic outflow tract murmur.    Plan    No indication of active breast pathology at this time.    Patient to return as needed. The patient is aware to call back for any questions or concerns.   HPI, Physical Exam, Assessment and Plan have been scribed under the  direction and in the presence of Donnalee CurryJeffrey Byrnett, MD.  Ples SpecterJessica Qualls, CMA  I have completed the exam and reviewed the above documentation for accuracy and completeness.  I agree with the above.  Museum/gallery conservator[employer]agon [employer] has been used and any errors in dictation or transcription are unintentional.  Donnalee CurryJeffrey Byrnett, M.D., F.A.C.S.  Earline MayotteByrnett, Jeffrey W 10/17/2017, 1:34 PM

## 2017-10-17 NOTE — Patient Instructions (Signed)
Patient to return as needed. The patient is aware to call back for any questions or concerns. 

## 2017-11-08 ENCOUNTER — Encounter: Payer: Self-pay | Admitting: *Deleted

## 2017-11-08 NOTE — Progress Notes (Signed)
Patient with benign findings.  She is to follow-up in one year with annual screening.  HSIS to Allendalehristy.

## 2018-10-24 ENCOUNTER — Encounter (INDEPENDENT_AMBULATORY_CARE_PROVIDER_SITE_OTHER): Payer: Self-pay

## 2018-10-24 ENCOUNTER — Ambulatory Visit: Payer: Self-pay | Attending: Oncology | Admitting: *Deleted

## 2018-10-24 ENCOUNTER — Encounter: Payer: Self-pay | Admitting: *Deleted

## 2018-10-24 ENCOUNTER — Ambulatory Visit
Admission: RE | Admit: 2018-10-24 | Discharge: 2018-10-24 | Disposition: A | Discharge status: Home / Self Care | Payer: Self-pay | Source: Ambulatory Visit | Attending: Oncology | Admitting: Oncology

## 2018-10-24 ENCOUNTER — Ambulatory Visit: Payer: Self-pay

## 2018-10-24 VITALS — BP 131/83 | HR 83 | Temp 98.4°F | Ht 65.0 in | Wt 191.3 lb

## 2018-10-24 DIAGNOSIS — Z0000 Encounter for general adult medical examination without abnormal findings: Secondary | ICD-10-CM

## 2018-10-24 NOTE — Progress Notes (Signed)
  Subjective:     Patient ID: Gabryela Traister, female   DOB: December 30, 1970, 48 y.o.   MRN: [id number]  HPI   Review of Systems     Objective:   Physical Exam Chest:     Breasts:        Right: No swelling, bleeding, inverted nipple, mass, nipple discharge, skin change or tenderness.        Left: No swelling, bleeding, inverted nipple, mass, nipple discharge, skin change or tenderness.        Assessment:     48 year old English speaking Hispanic female returns to [facility] for annual screening.  Clinical breast exam unremarkable. Taught self breast awareness.  Patient has been screened for eligibility.  She does not have any insurance, Medicare or Medicaid.  She also meets financial eligibility.  Hand-out given on the Affordable Care Act.  Risk Assessment    Risk Scores      10/24/2018   Last edited by: Alta Corning, CMA   5-year risk: 0.5 %   Lifetime risk: 6 %            Plan:     Screening mammogram ordered.  Will follow-up per [facility] protocol.

## 2018-10-24 NOTE — Patient Instructions (Signed)
Gave patient hand-out, Women Staying Healthy, Active and Well from [facility], with education on breast health, pap smears, heart and colon health. 

## 2018-10-29 ENCOUNTER — Encounter: Payer: Self-pay | Admitting: *Deleted

## 2018-10-29 NOTE — Progress Notes (Signed)
Letter mailed from the [facility] to inform patient of her normal mammogram results.  Patient is to follow-up with annual screening in one year.  HSIS to Christy. 

## 2019-11-08 ENCOUNTER — Telehealth: Payer: Self-pay

## 2019-11-11 NOTE — Progress Notes (Signed)
Patient pre-screened for [facility] eligibility due to COVID 19 precautions. Two patient identifiers used for verification that I was speaking to correct patient.  Patient to Present directly to [facility] 11/11/19 for [facility] screening mammogram.

## 2019-11-12 ENCOUNTER — Encounter: Payer: Self-pay | Admitting: *Deleted

## 2019-11-12 ENCOUNTER — Ambulatory Visit: Payer: Self-pay | Attending: Oncology | Admitting: *Deleted

## 2019-11-12 ENCOUNTER — Ambulatory Visit
Admission: RE | Admit: 2019-11-12 | Discharge: 2019-11-12 | Disposition: A | Discharge status: Home / Self Care | Payer: Self-pay | Source: Ambulatory Visit | Attending: Oncology | Admitting: Oncology

## 2019-11-12 DIAGNOSIS — Z0000 Encounter for general adult medical examination without abnormal findings: Secondary | ICD-10-CM | POA: Insufficient documentation

## 2019-11-12 NOTE — Progress Notes (Signed)
  Subjective:     Patient ID: Alisha Wang, female   DOB: [birth year], 49 y.o.   MRN: [id number]  HPI   Review of Systems     Objective:   Physical Exam     Assessment:     Due to Covid 19 pandemic a televist was used to enroll patient into our [facility] program and obtain her health history.  Patient denies any breast problems.  Patient has a history of hysterectomy for fibroids.  Pap omitted per protocol.  Patient has been screened for eligibility.  She does not have any insurance, Medicare or Medicaid.  She also meets financial eligibility.  See Dayna Barker breast cancer risk assessment below. Risk Assessment    Risk Scores      11/12/2019 10/24/2018   Last edited by: Scarlett Presto, RN [place], Freada Bergeron, CMA   5-year risk: 0.6 % 0.5 %   Lifetime risk: 5.9 % 6 %            Plan:     Screening mammogram ordered.  Will follow up per [facility] protocol.

## 2019-11-13 ENCOUNTER — Encounter: Payer: Self-pay | Admitting: *Deleted

## 2019-11-13 NOTE — Progress Notes (Signed)
Letter mailed from the [facility] to inform patient of her normal mammogram results.  Patient is to follow-up with annual screening in one year. 

## 2020-11-11 ENCOUNTER — Other Ambulatory Visit: Payer: Self-pay

## 2020-11-11 ENCOUNTER — Encounter: Payer: Self-pay | Admitting: *Deleted

## 2020-11-11 ENCOUNTER — Ambulatory Visit: Payer: Self-pay | Attending: Oncology | Admitting: *Deleted

## 2020-11-11 ENCOUNTER — Ambulatory Visit
Admission: RE | Admit: 2020-11-11 | Discharge: 2020-11-11 | Disposition: A | Discharge status: Home / Self Care | Payer: Self-pay | Source: Ambulatory Visit | Attending: Oncology | Admitting: Oncology

## 2020-11-11 DIAGNOSIS — Z0000 Encounter for general adult medical examination without abnormal findings: Secondary | ICD-10-CM

## 2020-11-11 NOTE — Progress Notes (Signed)
Patient pre-screened for [facility] eligibility due to COVID 19 precautions. Two patient identifiers used for verification that I was speaking to correct patient.  Patient to Present directly to [facility] today for [facility] screening mammogram.  °

## 2020-11-12 ENCOUNTER — Encounter: Payer: Self-pay | Admitting: *Deleted

## 2020-11-12 NOTE — Progress Notes (Signed)
Letter mailed from the [facility] to inform patient of her normal mammogram results.  Patient is to follow-up with annual screening in one year. 

## 2021-10-27 ENCOUNTER — Other Ambulatory Visit: Payer: Self-pay

## 2021-10-27 DIAGNOSIS — Z1231 Encounter for screening mammogram for malignant neoplasm of breast: Secondary | ICD-10-CM

## 2021-11-17 ENCOUNTER — Ambulatory Visit: Payer: Self-pay | Attending: Oncology

## 2022-01-25 ENCOUNTER — Ambulatory Visit: Payer: Self-pay

## 2022-03-02 ENCOUNTER — Other Ambulatory Visit: Payer: Self-pay

## 2022-03-02 DIAGNOSIS — Z1211 Encounter for screening for malignant neoplasm of colon: Secondary | ICD-10-CM

## 2022-03-09 ENCOUNTER — Ambulatory Visit
Admission: RE | Admit: 2022-03-09 | Discharge: 2022-03-09 | Disposition: A | Discharge status: Home / Self Care | Payer: Self-pay | Source: Ambulatory Visit | Attending: Obstetrics and Gynecology | Admitting: Obstetrics and Gynecology

## 2022-03-09 ENCOUNTER — Ambulatory Visit: Payer: Self-pay | Attending: Hematology and Oncology | Admitting: *Deleted

## 2022-03-09 VITALS — BP 143/98 | HR 75 | Resp 18 | Wt 192.0 lb

## 2022-03-09 DIAGNOSIS — Z1231 Encounter for screening mammogram for malignant neoplasm of breast: Secondary | ICD-10-CM | POA: Insufficient documentation

## 2022-03-09 DIAGNOSIS — Z1239 Encounter for other screening for malignant neoplasm of breast: Secondary | ICD-10-CM

## 2022-03-09 NOTE — Progress Notes (Signed)
[name] is a 51 y.o. female who presents to [facility] clinic today with no complaints.    Pap Smear: Pap smear not completed today. Last Pap smear was 12/28/2009 at [facility] clinic and was normal. Per patient has no history of an abnormal Pap smear. Patient has a history of a hysterectomy 06/13/2013 due to uterovaginal prolapse and urge incontinence. Patient doesn't need any further Pap smears due to her history of a hysterectomy for benign reasons per [facility] and ASCCP guidelines. Last Pap smear result is available in Epic.   Physical exam: Breasts Breasts symmetrical. No skin abnormalities bilateral breasts. No nipple retraction bilateral breasts. No nipple discharge bilateral breasts. No lymphadenopathy. No lumps palpated bilateral breasts. No complaints of pain or tenderness on exam.     MS DIGITAL SCREENING TOMO BILATERAL  Result Date: 11/11/2020 CLINICAL DATA:  Screening. EXAM: DIGITAL SCREENING BILATERAL MAMMOGRAM WITH TOMO AND CAD COMPARISON:  Previous exam(s). ACR Breast Density Category b: There are scattered areas of fibroglandular density. FINDINGS: There are no findings suspicious for malignancy. The images were evaluated with computer-aided detection. IMPRESSION: No mammographic evidence of malignancy. A result letter of this screening mammogram will be mailed directly to the patient. RECOMMENDATION: Screening mammogram in one year. (Code:SM-B-01Y) BI-RADS CATEGORY  1: Negative. Electronically Signed   By: Beckie Salts M.D.   On: 11/11/2020 14:23   MS DIGITAL SCREENING TOMO BILATERAL  Result Date: 11/12/2019 CLINICAL DATA:  Screening. EXAM: DIGITAL SCREENING BILATERAL MAMMOGRAM WITH TOMO AND CAD COMPARISON:  Previous exam(s). ACR Breast Density Category b: There are scattered areas of fibroglandular density. FINDINGS: There are no findings suspicious for malignancy. Images were processed with CAD. IMPRESSION: No mammographic evidence of malignancy. A result letter of this screening  mammogram will be mailed directly to the patient. RECOMMENDATION: Screening mammogram in one year. (Code:SM-B-01Y) BI-RADS CATEGORY  1: Negative. Electronically Signed   By: Baird Lyons M.D.   On: 11/12/2019 15:38   MS DIGITAL SCREENING TOMO BILATERAL  Result Date: 10/24/2018 CLINICAL DATA:  Screening. EXAM: DIGITAL SCREENING BILATERAL MAMMOGRAM WITH TOMO AND CAD COMPARISON:  Previous exam(s). ACR Breast Density Category b: There are scattered areas of fibroglandular density. FINDINGS: There are no findings suspicious for malignancy. Images were processed with CAD. IMPRESSION: No mammographic evidence of malignancy. A result letter of this screening mammogram will be mailed directly to the patient. RECOMMENDATION: Screening mammogram in one year. (Code:SM-B-01Y) BI-RADS CATEGORY  1: Negative. Electronically Signed   By: Sande Brothers M.D.   On: 10/24/2018 10:54   MS DIGITAL DIAG TOMO BILAT  Result Date: 09/07/2017 CLINICAL DATA:  Focal pain right breast EXAM: 2D DIGITAL DIAGNOSTIC BILATERAL MAMMOGRAM WITH CAD AND ADJUNCT TOMO ULTRASOUND RIGHT BREAST COMPARISON:  Previous exam(s). ACR Breast Density Category b: There are scattered areas of fibroglandular density. FINDINGS: CC and MLO views of bilateral breasts and spot tangential view of right breast are submitted. No suspicious abnormalities identified bilaterally. Mammographic images were processed with CAD. Targeted ultrasound is performed, showing no focal abnormal discrete cystic or solid lesion in focal areas of pain from right breast 8 o'clock through 10 o'clock near lateral chest wall. IMPRESSION: Negative. RECOMMENDATION: Routine screening mammogram in 1 year. I have discussed the findings and recommendations with the patient. Results were also provided in writing at the conclusion of the visit. If applicable, a reminder letter will be sent to the patient regarding the next appointment. BI-RADS CATEGORY  1: Negative. Electronically Signed   By:  Gabriel Carina.D.  On: 09/07/2017 10:02     Pelvic/Bimanual Pap is not indicated today per [facility] guidelines.   Smoking History: Patient has never smoked.   Patient Navigation: Patient education provided. Access to services provided for patient through [facility] program.   Colorectal Cancer Screening: Per patient has never had colonoscopy completed. Patient completed a FIT Test 10/29/2021 that was negative. No complaints today.    Breast and Cervical Cancer Risk Assessment: Patient does not have family history of breast cancer, known genetic mutations, or radiation treatment to the chest before age 59. Patient does not have history of cervical dysplasia, immunocompromised, or DES exposure in-utero.  Risk Assessment     Risk Scores       03/09/2022 11/11/2020   Last edited by: Lesle Chris, RN Scarlett Presto, RN   5-year risk: 0.6 % 0.6 %   Lifetime risk: 5.2 % 5.8 %            A: [facility] exam without pap smear No complaints.  P: Referred patient to the [facility] for a screening mammogram. Appointment scheduled Wednesday, Mar 09, 2022 at 1240.  Priscille Heidelberg, RN 03/09/2022 12:34 PM

## 2022-03-09 NOTE — Patient Instructions (Signed)
Explained breast self awareness with Alisha Wang. Patient did not need a Pap smear today due to patient has a history of a hysterectomy for benign reasons. Let patient know that she doesn't need any further Pap smears due to her history of a hysterectomy for benign reasons. Referred patient to the [facility] for a screening mammogram. Appointment scheduled Wednesday, Mar 09, 2022 at 1240. Patient aware of appointment and will be there. Let patient know Delford Field will follow up with her within the next couple weeks with results of mammogram by letter or phone. Alisha Wang verbalized understanding.  Orien Mayhall, Kathaleen Maser, RN 12:34 PM

## 2022-06-27 IMAGING — MG MM DIGITAL SCREENING BILAT W/ TOMO AND CAD
6 of 10 series · 6 of 30 positions shown · non-contrast
Comparison: Previous exam(s).

CLINICAL DATA: Screening.

EXAM:
DIGITAL SCREENING BILATERAL MAMMOGRAM WITH TOMOSYNTHESIS AND CAD
TECHNIQUE: Bilateral screening digital craniocaudal and mediolateral oblique
mammograms were obtained. Bilateral screening digital breast
tomosynthesis was performed. The images were evaluated with
computer-aided detection.

[R MLO synth-2D (1 of 2)]
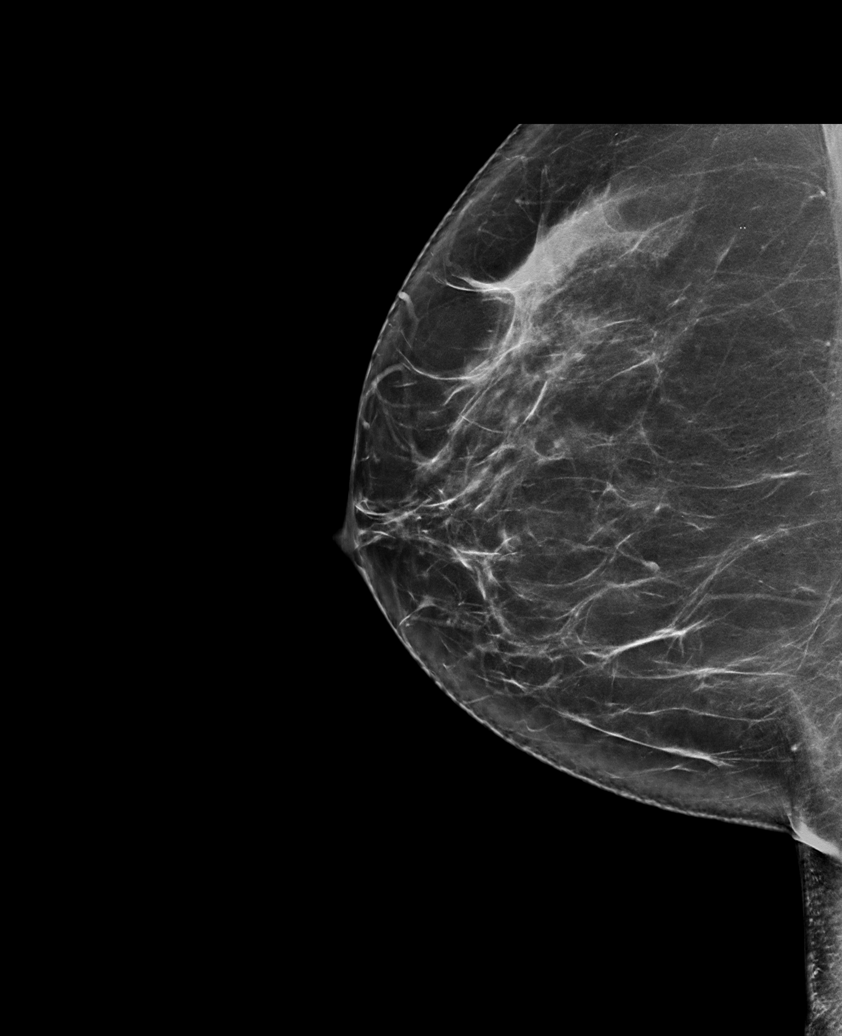

[R CC synth-2D]
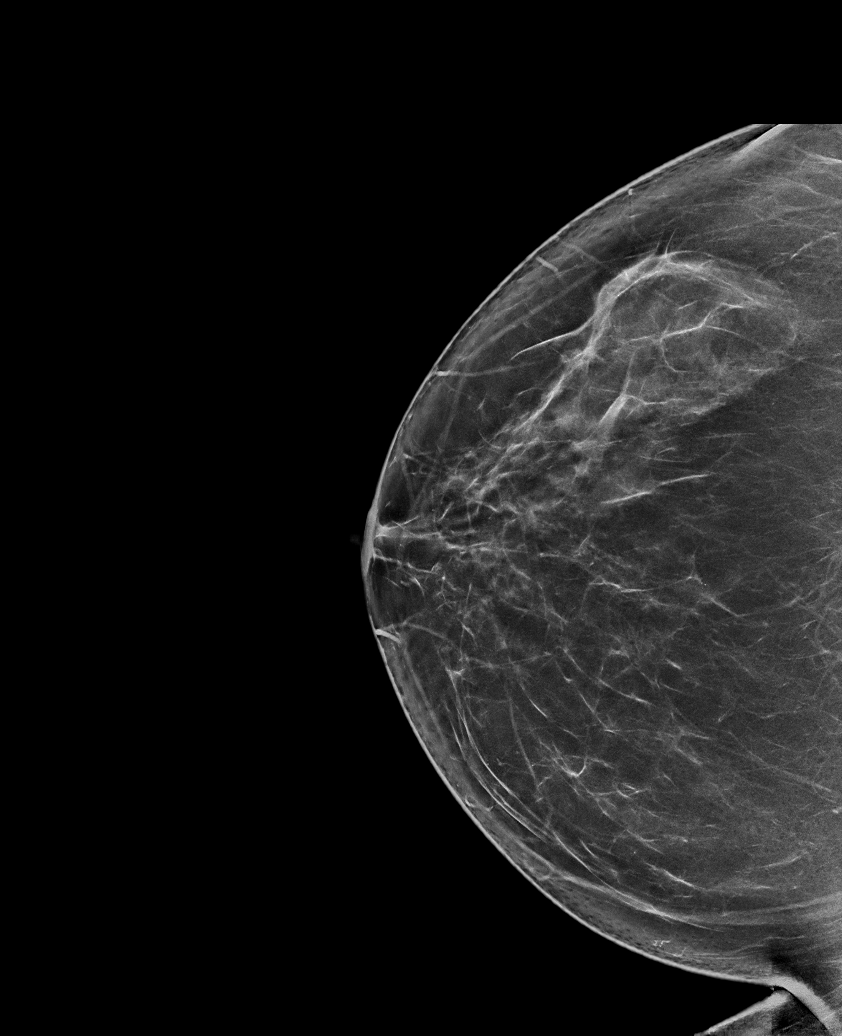

[R MLO synth-2D (2 of 2)]
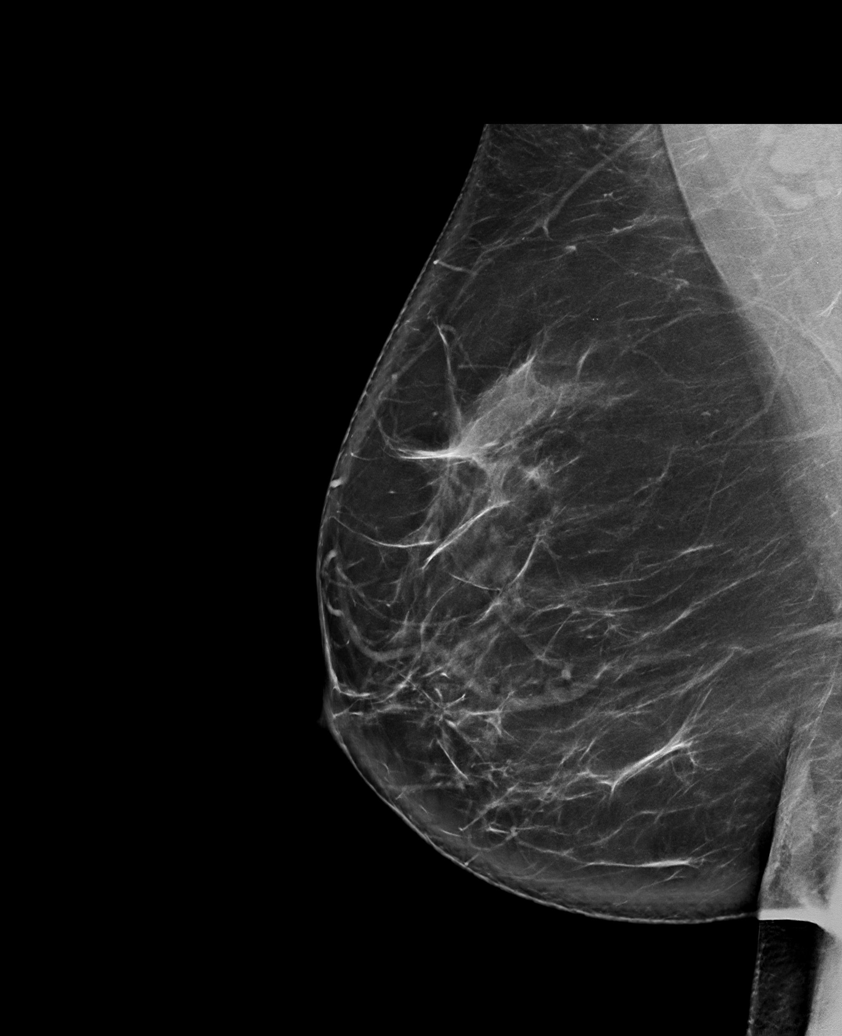

[L MLO synth-2D]
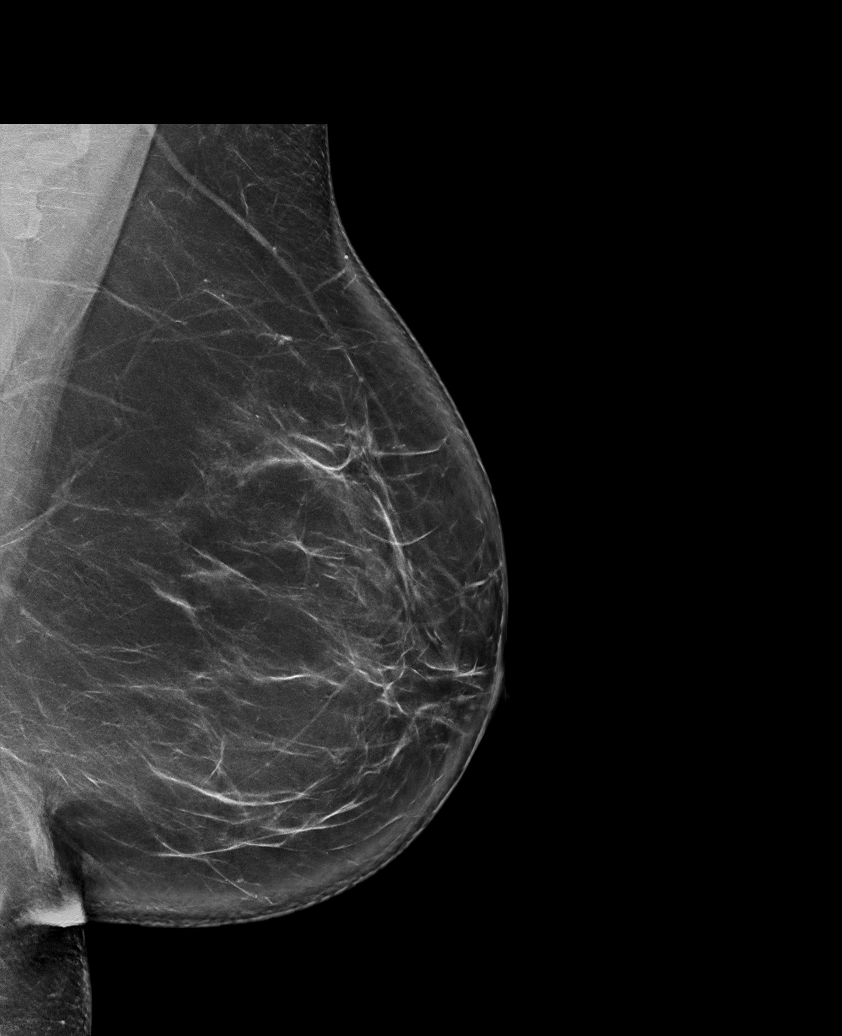

[L CC synth-2D]
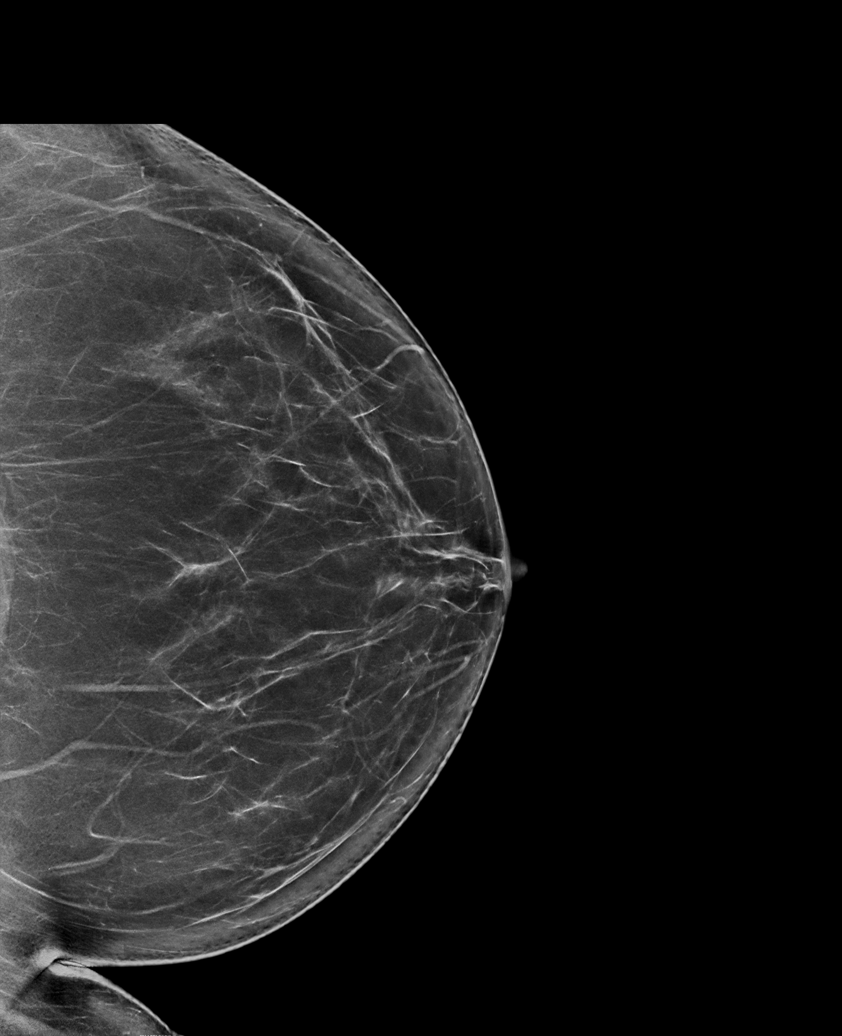

[L MLO tomo · tomo slice 49/97.0]
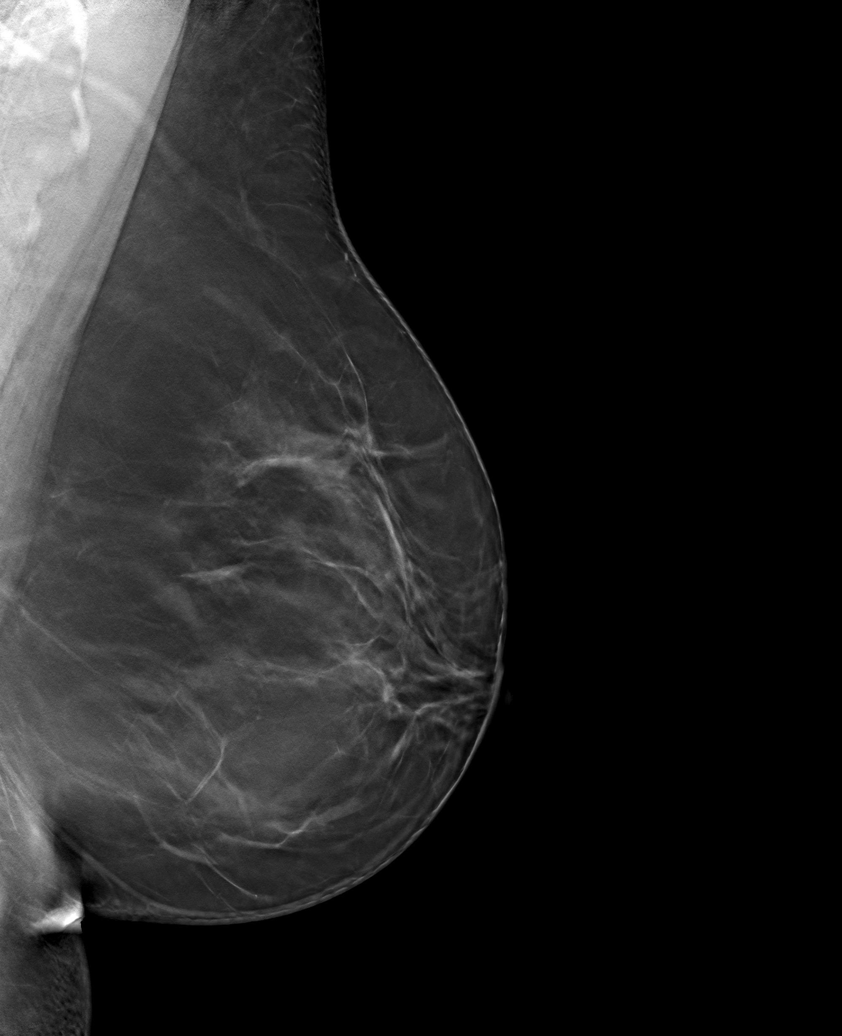

[6 of 30 positions shown; findings below may reference images not displayed]

ACR Breast Density Category b: There are scattered areas of
fibroglandular density.
FINDINGS: There are no findings suspicious for malignancy.
IMPRESSION: No mammographic evidence of malignancy. A result letter of this
screening mammogram will be mailed directly to the patient.

RECOMMENDATION:
Screening mammogram in one year. (Code:51-O-LD2)

BI-RADS CATEGORY  1: Negative.

## 2022-12-06 ENCOUNTER — Encounter: Payer: Self-pay | Admitting: Family Medicine

## 2023-02-01 ENCOUNTER — Other Ambulatory Visit: Payer: Self-pay

## 2023-02-01 DIAGNOSIS — Z1231 Encounter for screening mammogram for malignant neoplasm of breast: Secondary | ICD-10-CM

## 2023-03-13 ENCOUNTER — Ambulatory Visit
Admission: RE | Admit: 2023-03-13 | Discharge: 2023-03-13 | Disposition: A | Discharge status: Home / Self Care | Payer: Medicaid Other | Source: Ambulatory Visit | Attending: Obstetrics and Gynecology | Admitting: Obstetrics and Gynecology

## 2023-03-13 ENCOUNTER — Ambulatory Visit: Payer: Self-pay | Attending: Hematology and Oncology | Admitting: Hematology and Oncology

## 2023-03-13 VITALS — BP 160/104 | Wt 191.4 lb

## 2023-03-13 DIAGNOSIS — Z1231 Encounter for screening mammogram for malignant neoplasm of breast: Secondary | ICD-10-CM

## 2023-03-13 DIAGNOSIS — Z1239 Encounter for other screening for malignant neoplasm of breast: Secondary | ICD-10-CM

## 2023-03-13 NOTE — Progress Notes (Signed)
[name] is a 52 y.o. female who presents to [facility] clinic today with no complaints.    Pap Smear: Pap not smear completed today due to benign hysterectomy in 2014.  Physical exam: Breasts Breasts symmetrical. No skin abnormalities bilateral breasts. No nipple retraction bilateral breasts. No nipple discharge bilateral breasts. No lymphadenopathy. No lumps palpated bilateral breasts.   MS DIGITAL SCREENING TOMO BILATERAL  Result Date: 03/10/2022 CLINICAL DATA:  Screening. EXAM: DIGITAL SCREENING BILATERAL MAMMOGRAM WITH TOMOSYNTHESIS AND CAD TECHNIQUE: Bilateral screening digital craniocaudal and mediolateral oblique mammograms were obtained. Bilateral screening digital breast tomosynthesis was performed. The images were evaluated with computer-aided detection. COMPARISON:  Previous exam(s). ACR Breast Density Category b: There are scattered areas of fibroglandular density. FINDINGS: There are no findings suspicious for malignancy. IMPRESSION: No mammographic evidence of malignancy. A result letter of this screening mammogram will be mailed directly to the patient. RECOMMENDATION: Screening mammogram in one year. (Code:SM-B-01Y) BI-RADS CATEGORY  1: Negative. Electronically Signed   By: Elberta Fortis M.D.   On: 03/10/2022 14:45   MS DIGITAL SCREENING TOMO BILATERAL  Result Date: 11/11/2020 CLINICAL DATA:  Screening. EXAM: DIGITAL SCREENING BILATERAL MAMMOGRAM WITH TOMO AND CAD COMPARISON:  Previous exam(s). ACR Breast Density Category b: There are scattered areas of fibroglandular density. FINDINGS: There are no findings suspicious for malignancy. The images were evaluated with computer-aided detection. IMPRESSION: No mammographic evidence of malignancy. A result letter of this screening mammogram will be mailed directly to the patient. RECOMMENDATION: Screening mammogram in one year. (Code:SM-B-01Y) BI-RADS CATEGORY  1: Negative. Electronically Signed   By: Beckie Salts M.D.   On: 11/11/2020  14:23   MS DIGITAL SCREENING TOMO BILATERAL  Result Date: 11/12/2019 CLINICAL DATA:  Screening. EXAM: DIGITAL SCREENING BILATERAL MAMMOGRAM WITH TOMO AND CAD COMPARISON:  Previous exam(s). ACR Breast Density Category b: There are scattered areas of fibroglandular density. FINDINGS: There are no findings suspicious for malignancy. Images were processed with CAD. IMPRESSION: No mammographic evidence of malignancy. A result letter of this screening mammogram will be mailed directly to the patient. RECOMMENDATION: Screening mammogram in one year. (Code:SM-B-01Y) BI-RADS CATEGORY  1: Negative. Electronically Signed   By: Baird Lyons M.D.   On: 11/12/2019 15:38   MS DIGITAL SCREENING TOMO BILATERAL  Result Date: 10/24/2018 CLINICAL DATA:  Screening. EXAM: DIGITAL SCREENING BILATERAL MAMMOGRAM WITH TOMO AND CAD COMPARISON:  Previous exam(s). ACR Breast Density Category b: There are scattered areas of fibroglandular density. FINDINGS: There are no findings suspicious for malignancy. Images were processed with CAD. IMPRESSION: No mammographic evidence of malignancy. A result letter of this screening mammogram will be mailed directly to the patient. RECOMMENDATION: Screening mammogram in one year. (Code:SM-B-01Y) BI-RADS CATEGORY  1: Negative. Electronically Signed   By: Sande Brothers M.D.   On: 10/24/2018 10:54        Pelvic/Bimanual Pap is not indicated today    Smoking History: Patient has never smoked and was not referred to quit line.    Patient Navigation: Patient education provided. Access to services provided for patient through BCCCP program. Delos Haring interpreter provided. No transportation provided   Colorectal Cancer Screening: Per patient has never had colonoscopy completed No complaints today. FIT test negative 12/01/22 per Phineas Real   Breast and Cervical Cancer Risk Assessment: Patient does not have family history of breast cancer, known genetic mutations, or radiation treatment to  the chest before age 67. Patient does not have history of cervical dysplasia, immunocompromised, or DES exposure in-utero.  Risk  Assessment   No risk assessment data for the current encounter  Risk Scores       03/09/2022   Last edited by: Lesle Chris, RN   5-year risk: 0.6 %   Lifetime risk: 5.2 %            A: BCCCP exam without pap smear No complaints with benign exam.   P: Referred patient to the [facility] for a screening mammogram. Appointment scheduled 03/13/23.  Pascal Lux, NP 03/13/2023 9:25 AM

## 2023-03-13 NOTE — Patient Instructions (Signed)
Taught Timmie Razzano how to perform BSE and gave educational materials to take home. Patient did not need a Pap smear today due to benign hysterectomy in 2014. Referred patient to the [facility] for screening mammogram. Appointment scheduled for 03/13/23. Patient aware of appointment and will be there. Let patient know will follow up with her within the next couple weeks with results. Samoni Plumley verbalized understanding.  Pascal Lux, NP 9:25 AM

## 2024-04-09 ENCOUNTER — Other Ambulatory Visit: Payer: Self-pay

## 2024-04-09 DIAGNOSIS — Z1231 Encounter for screening mammogram for malignant neoplasm of breast: Secondary | ICD-10-CM

## 2024-05-06 ENCOUNTER — Ambulatory Visit
Admission: RE | Admit: 2024-05-06 | Discharge: 2024-05-06 | Disposition: A | Discharge status: Home / Self Care | Payer: Self-pay | Source: Ambulatory Visit | Attending: Obstetrics and Gynecology | Admitting: Obstetrics and Gynecology

## 2024-05-06 ENCOUNTER — Ambulatory Visit: Payer: Self-pay | Attending: Obstetrics and Gynecology | Admitting: *Deleted

## 2024-05-06 VITALS — BP 151/97 | Ht 65.0 in | Wt 200.0 lb

## 2024-05-06 DIAGNOSIS — Z1239 Encounter for other screening for malignant neoplasm of breast: Secondary | ICD-10-CM

## 2024-05-06 DIAGNOSIS — Z1231 Encounter for screening mammogram for malignant neoplasm of breast: Secondary | ICD-10-CM | POA: Insufficient documentation

## 2024-05-06 NOTE — Patient Instructions (Signed)
 Explained breast self awareness with Nani Upchurch. Patient did not need a Pap smear today due to patient has a history of a hysterectomy for benign reasons. Let patient know that she doesn't need any further Pap smears due to her history of a hysterectomy for benign reasons. Referred patient to the [facility] for a screening mammogram. Appointment scheduled Monday, May 06, 2024 at 1600. Patient aware of appointment and will be there. Let patient know Raymondo will follow up with her within the next couple weeks with results of her mammogram by letter or phone. Alisha Wang verbalized understanding.  Alisha Wang, Wanda Ship, RN 3:33 PM

## 2024-05-06 NOTE — Progress Notes (Signed)
 Alisha Wang is a 53 y.o. female who presents to [facility] clinic today with no complaints.    Pap Smear: Pap smear not completed today. Last Pap smear was 11/07/2011 at [facility] clinic and was normal. Per patient has no history of an abnormal Pap smear. Patient has a history of a hysterectomy 06/13/2013 due to uterine prolapse. Patient doesn't need any further Pap smears due to her history of a hysterectomy for benign reasons per BCCCP and ASCCP guidelines. Last Pap smear result is available in Epic.   Physical exam: Breasts Breasts symmetrical. No skin abnormalities bilateral breasts. No nipple retraction bilateral breasts. No nipple discharge bilateral breasts. No lymphadenopathy. No lumps palpated bilateral breasts. No complaints of pain or tenderness on exam.      MS 3D SCR MAMMO BILAT BR (aka MM) Result Date: 03/14/2023 CLINICAL DATA:  Screening. EXAM: DIGITAL SCREENING BILATERAL MAMMOGRAM WITH TOMOSYNTHESIS AND CAD TECHNIQUE: Bilateral screening digital craniocaudal and mediolateral oblique mammograms were obtained. Bilateral screening digital breast tomosynthesis was performed. The images were evaluated with computer-aided detection. COMPARISON:  Previous exam(s). ACR Breast Density Category b: There are scattered areas of fibroglandular density. FINDINGS: There are no findings suspicious for malignancy. IMPRESSION: No mammographic evidence of malignancy. A result letter of this screening mammogram will be mailed directly to the patient. RECOMMENDATION: Screening mammogram in one year. (Code:SM-B-01Y) BI-RADS CATEGORY  1: Negative. Electronically Signed   By: Serena  Chacko M.D.   On: 03/14/2023 11:06   MS DIGITAL SCREENING TOMO BILATERAL Result Date: 03/10/2022 CLINICAL DATA:  Screening. EXAM: DIGITAL SCREENING BILATERAL MAMMOGRAM WITH TOMOSYNTHESIS AND CAD TECHNIQUE: Bilateral screening digital craniocaudal and mediolateral oblique mammograms were obtained. Bilateral screening digital breast  tomosynthesis was performed. The images were evaluated with computer-aided detection. COMPARISON:  Previous exam(s). ACR Breast Density Category b: There are scattered areas of fibroglandular density. FINDINGS: There are no findings suspicious for malignancy. IMPRESSION: No mammographic evidence of malignancy. A result letter of this screening mammogram will be mailed directly to the patient. RECOMMENDATION: Screening mammogram in one year. (Code:SM-B-01Y) BI-RADS CATEGORY  1: Negative. Electronically Signed   By: Toribio Agreste M.D.   On: 03/10/2022 14:45   MS DIGITAL SCREENING TOMO BILATERAL Result Date: 11/11/2020 CLINICAL DATA:  Screening. EXAM: DIGITAL SCREENING BILATERAL MAMMOGRAM WITH TOMO AND CAD COMPARISON:  Previous exam(s). ACR Breast Density Category b: There are scattered areas of fibroglandular density. FINDINGS: There are no findings suspicious for malignancy. The images were evaluated with computer-aided detection. IMPRESSION: No mammographic evidence of malignancy. A result letter of this screening mammogram will be mailed directly to the patient. RECOMMENDATION: Screening mammogram in one year. (Code:SM-B-01Y) BI-RADS CATEGORY  1: Negative. Electronically Signed   By: Elspeth Bathe M.D.   On: 11/11/2020 14:23   MS DIGITAL SCREENING TOMO BILATERAL Result Date: 11/12/2019 CLINICAL DATA:  Screening. EXAM: DIGITAL SCREENING BILATERAL MAMMOGRAM WITH TOMO AND CAD COMPARISON:  Previous exam(s). ACR Breast Density Category b: There are scattered areas of fibroglandular density. FINDINGS: There are no findings suspicious for malignancy. Images were processed with CAD. IMPRESSION: No mammographic evidence of malignancy. A result letter of this screening mammogram will be mailed directly to the patient. RECOMMENDATION: Screening mammogram in one year. (Code:SM-B-01Y) BI-RADS CATEGORY  1: Negative. Electronically Signed   By: Dina  Arceo M.D.   On: 11/12/2019 15:38    Pelvic/Bimanual Pap is not indicated  today per BCCCP guidelines.   Smoking History: Patient has never smoked.   Patient Navigation: Patient education provided. Access to services  provided for patient through [employer] program. Spanish interpreter Alan Been from [facility] provided.   Colorectal Cancer Screening: Per patient has never had colonoscopy completed. Patient stated completed a FIT Test given by her PCP in April 2025. No complaints today.    Breast and Cervical Cancer Risk Assessment: Patient does not have family history of breast cancer, known genetic mutations, or radiation treatment to the chest before age 50. Patient does not have history of cervical dysplasia, immunocompromised, or DES exposure in-utero.  Risk Scores as of Encounter on 05/06/2024     Alisha Wang           5-year 0.71%   Lifetime 6.37%            Last calculated by Silas, Ansyi K, CMA on 05/06/2024 at  3:31 PM        A: BCCCP exam without pap smear No complaints.  P: Referred patient to the [facility] for a screening mammogram. Appointment scheduled Monday, May 06, 2024 at 1600.  Driscilla Wanda SQUIBB, RN 05/06/2024 3:33 PM
